# Patient Record
Sex: Female | Born: 1995 | Race: Black or African American | Hispanic: No | Marital: Single | Smoking: Never smoker
Health system: Southern US, Community
[De-identification: ages and names within clinical notes are randomized; demographics above are authoritative.]

## PROBLEM LIST (undated history)

## (undated) DIAGNOSIS — F419 Anxiety disorder, unspecified: Secondary | ICD-10-CM

## (undated) DIAGNOSIS — E049 Nontoxic goiter, unspecified: Secondary | ICD-10-CM

## (undated) HISTORY — PX: WISDOM TOOTH EXTRACTION: SHX21

---
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
## 2016-06-09 IMAGING — US US THYROID BIOPSY
1 series · 13 of 14 positions shown · non-contrast
Comparison: Thyroid ultrasound - 12/07/2014

MEDICATIONS:
None

COMPLICATIONS:
None immediate

INDICATION: Indeterminate thyroid nodule

EXAM:
ULTRASOUND GUIDED FINE NEEDLE ASPIRATION OF INDETERMINATE THYROID
NODULE
TECHNIQUE: Informed written consent was obtained from the patient after a
discussion of the risks, benefits and alternatives to treatment.
Questions regarding the procedure were encouraged and answered. A
timeout was performed prior to the initiation of the procedure.

[Series 1: us thyroid biopsy · 0.07mm/px · 14 acquisitions, 13 frames shown]
[im 1/14]
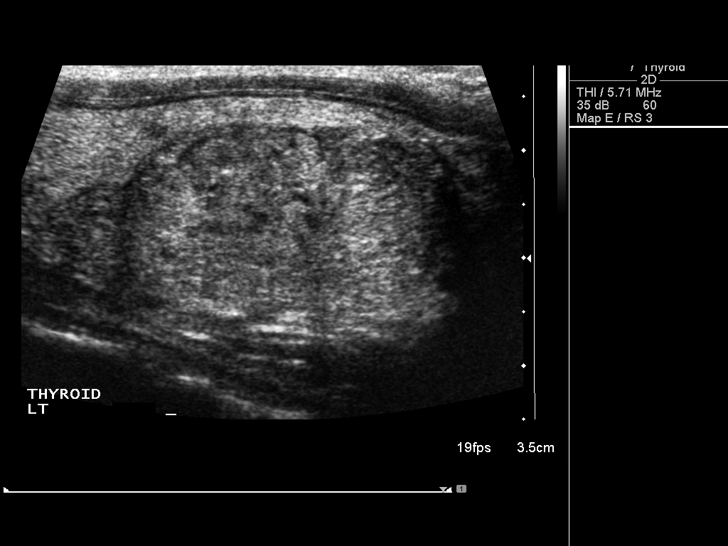
[im 2/14]
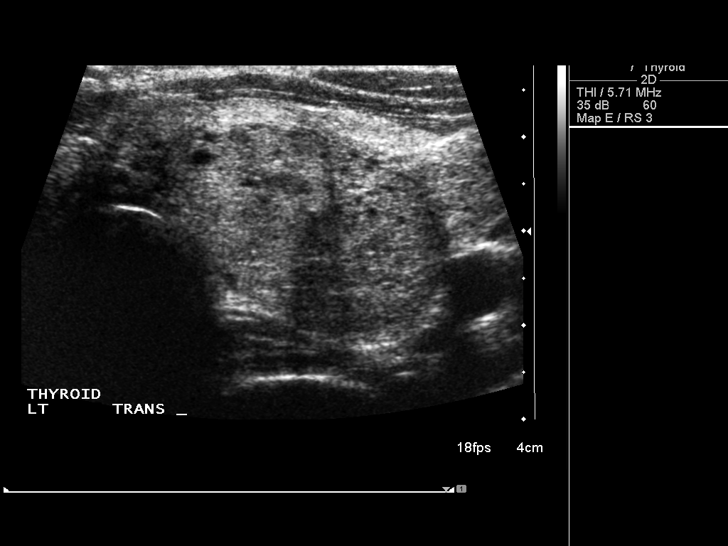
[im 3/14]
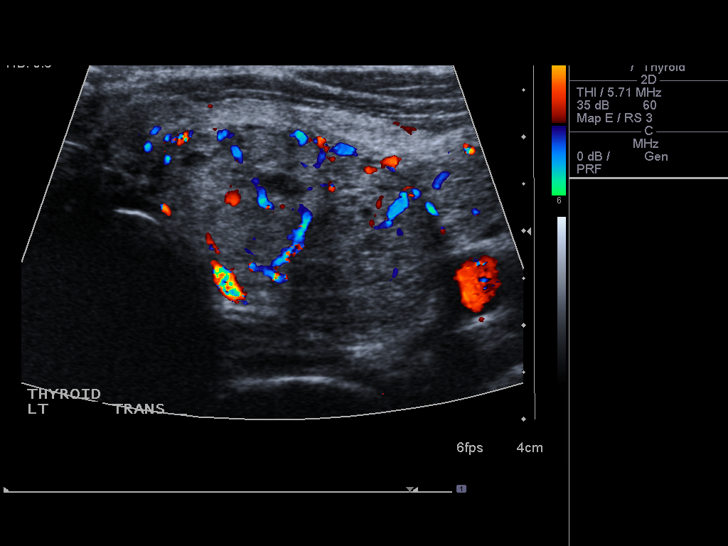
[im 4/14]
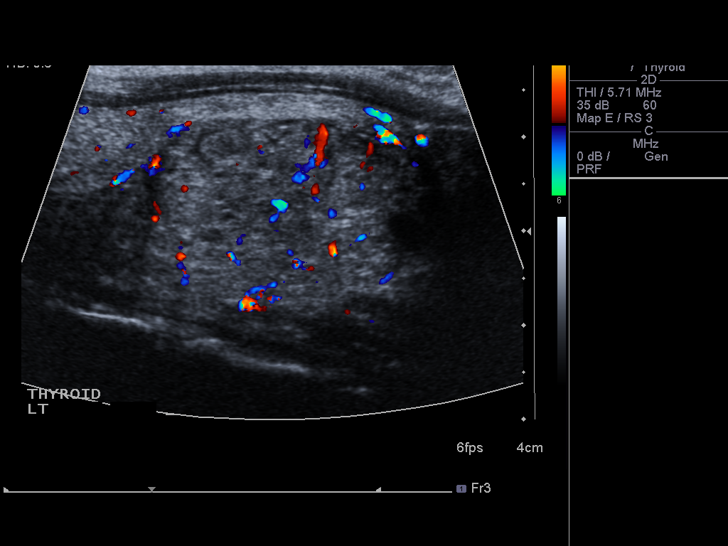
[im 5/14]
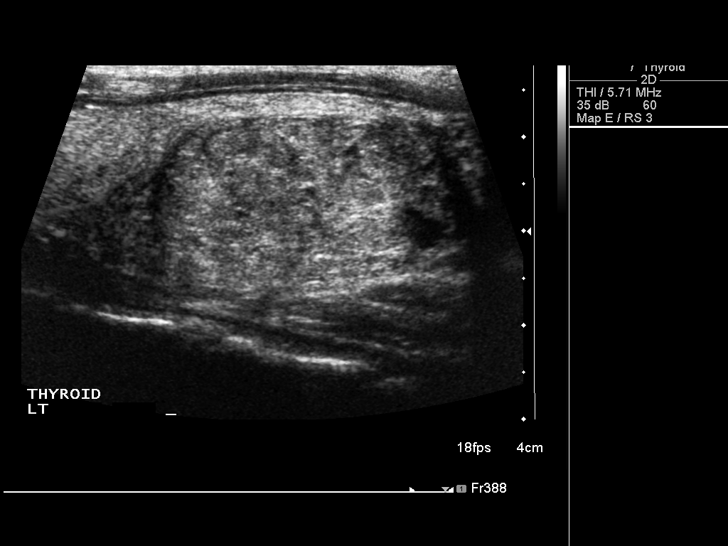
[im 6/14]
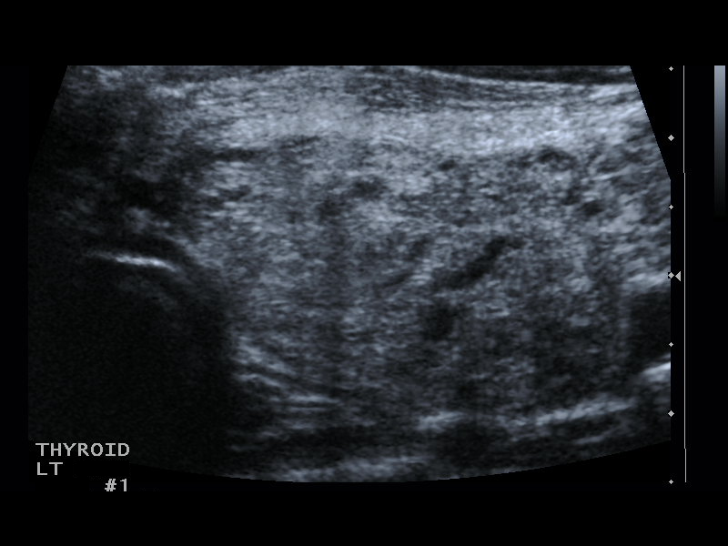
[im 8/14]
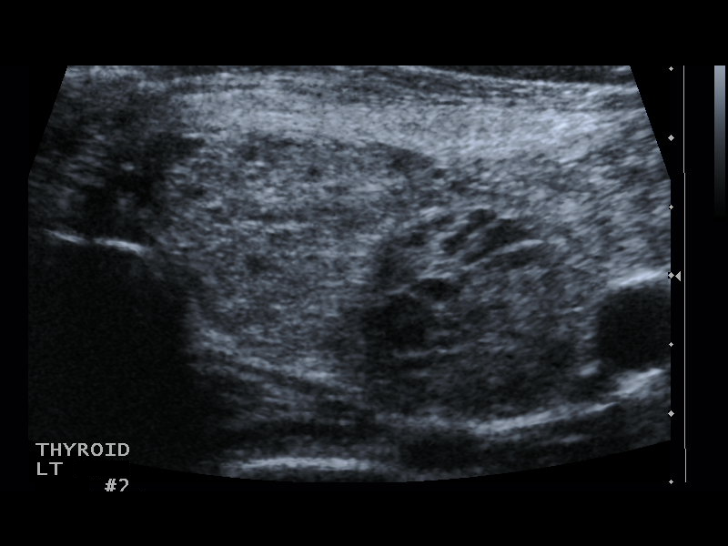
[im 9/14]
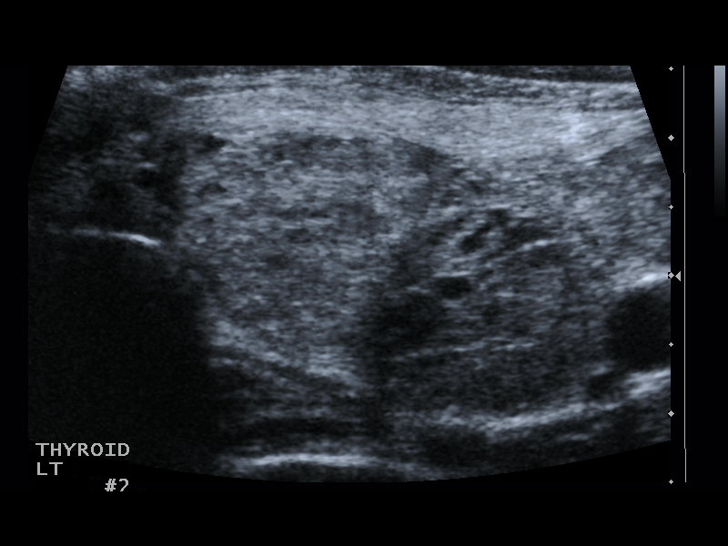
[im 10/14]
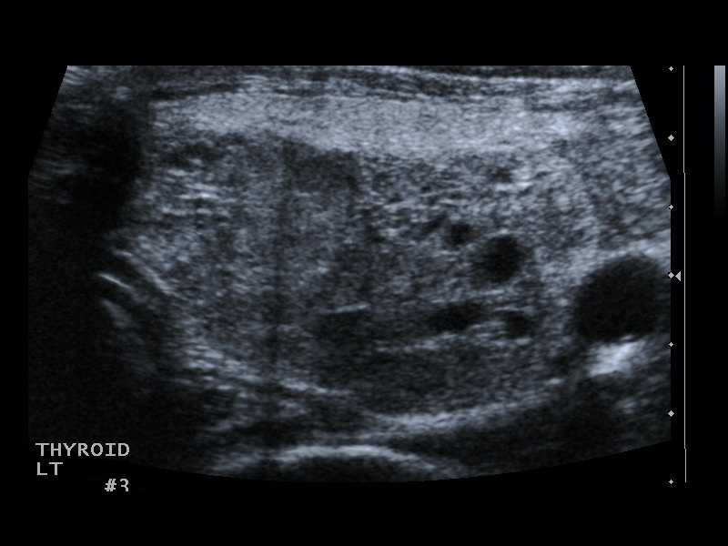
[im 11/14]
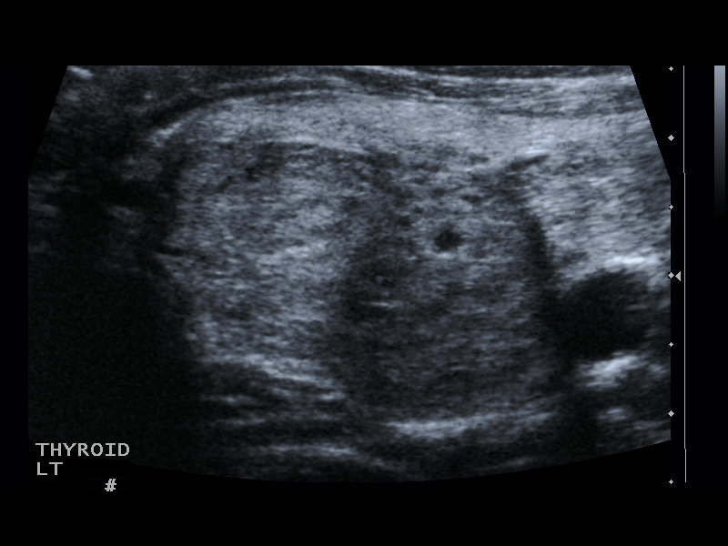
[im 12/14]
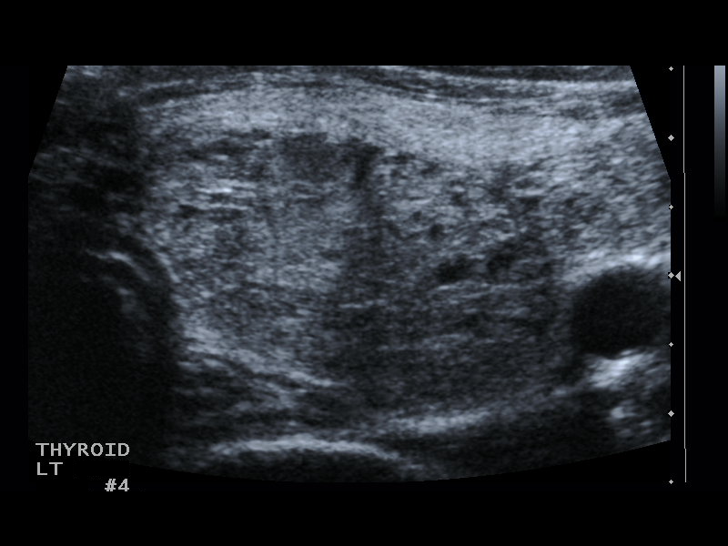
[im 13/14]
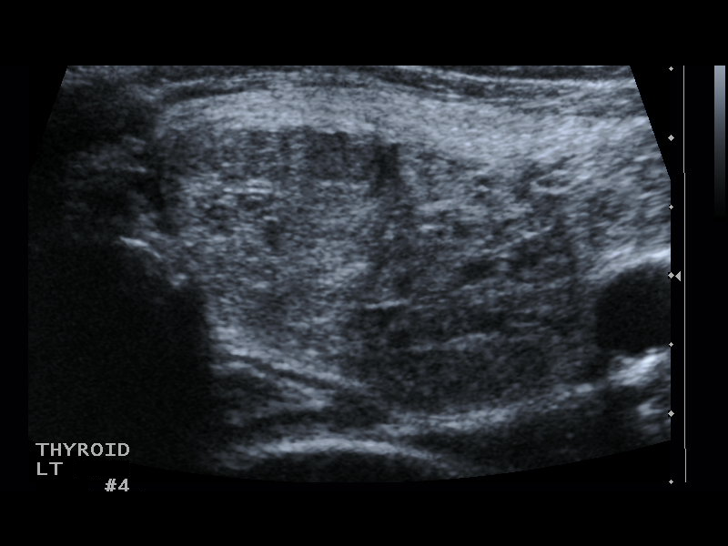
[im 14/14]
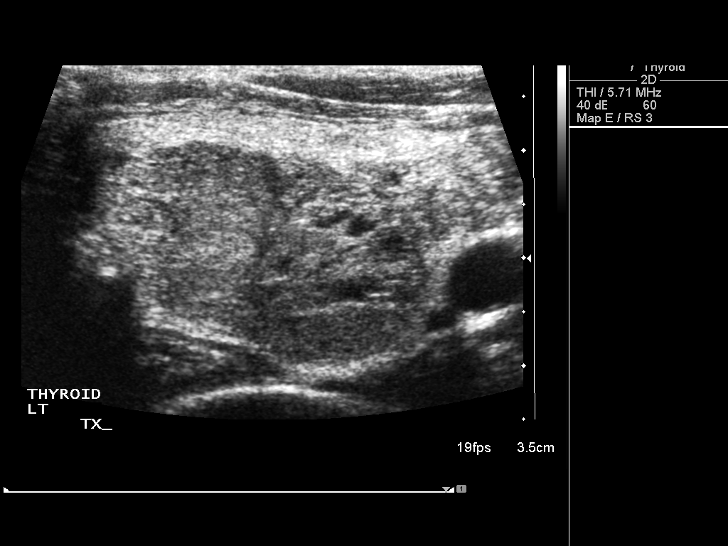

[13 of 14 positions shown; findings below may reference images not displayed]

Pre-procedural ultrasound scanning demonstrated grossly unchanged
appearance of dominant ill-defined at least 4.8 cm mixed echogenic
nodule / mass replacing the caudal aspect the left lobe of the
thyroid.

The procedure was planned. The neck was prepped in the usual sterile
fashion, and a sterile drape was applied covering the operative
field. A timeout was performed prior to the initiation of the
procedure. Local anesthesia was provided with 1% lidocaine.

Under direct ultrasound guidance, 4 FNA biopsies were performed of
the dominant left-sided thyroid nodule/mass with a 27 gauge needle.
The samples were prepared and submitted to pathology.

Limited post procedural scanning was negative for hematoma or
additional complication. Dressings were placed. The patient
tolerated the above procedures procedure well without immediate
postprocedural complication.
IMPRESSION: Technically successful ultrasound guided fine needle aspiration of
dominant left-sided thyroid nodule/mass.

## 2018-05-26 ENCOUNTER — Ambulatory Visit: Payer: Self-pay | Admitting: Surgery

## 2019-02-14 ENCOUNTER — Ambulatory Visit: Payer: Self-pay | Admitting: Surgery

## 2019-03-28 ENCOUNTER — Encounter (HOSPITAL_COMMUNITY): Payer: Self-pay | Admitting: *Deleted

## 2019-03-30 ENCOUNTER — Encounter (HOSPITAL_COMMUNITY): Payer: Self-pay

## 2019-03-30 NOTE — Patient Instructions (Signed)
DUE TO COVID-19 ONLY ONE VISITOR IS ALLOWED IN THE HOSPITAL AT THIS TIME   COVID SWAB TESTING COMPLETED ON: Today, March 31, 2019 at [place], [place] [facility] enter pre surgical testing line (Must self quarantine after testing. Follow instructions on handout.)             Your procedure is scheduled on: Monday, Aug, 3, 2020   Report to [facility] Main  Entrance   Report to Short Stay at 5:30 AM    Report to admitting at AM   Call this number if you have problems the morning of surgery [phone]   Do not eat food or drink liquids :After Midnight.   Brush your teeth the morning of surgery.   Do NOT smoke after Midnight   Take these medicines the morning of surgery with A SIP OF WATER: None                               You may not have any metal on your body including hair pins, jewelry, and body piercings             Do not wear make-up, lotions, powders, perfumes/cologne, or deodorant             Do not wear nail polish.  Do not shave  48 hours prior to surgery.              Do not bring valuables to the hospital. [place].   Contacts, dentures or bridgework may not be worn into surgery.   Bring small overnight bag day of surgery.    Special Instructions: Bring a copy of your healthcare power of attorney and living will documents         the day of surgery if you haven't scanned them in before.              Please read over the following fact sheets you were given:  [facility] - Preparing for Surgery Before surgery, you can play an important role.  Because skin is not sterile, your skin needs to be as free of germs as possible.  You can reduce the number of germs on your skin by washing with CHG (chlorahexidine gluconate) soap before surgery.  CHG is an antiseptic cleaner which kills germs and bonds with the skin to continue killing germs even after washing. Please DO  NOT use if you have an allergy to CHG or antibacterial soaps.  If your skin becomes reddened/irritated stop using the CHG and inform your nurse when you arrive at Short Stay. Do not shave (including legs and underarms) for at least 48 hours prior to the first CHG shower.  You may shave your face/neck.  Please follow these instructions carefully:  1.  Shower with CHG Soap the night before surgery and the  morning of surgery.  2.  If you choose to wash your hair, wash your hair first as usual with your normal  shampoo.  3.  After you shampoo, rinse your hair and body thoroughly to remove the shampoo.                             4.  Use CHG as you would  any other liquid soap.  You can apply chg directly to the skin and wash.  Gently with a scrungie or clean washcloth.  5.  Apply the CHG Soap to your body ONLY FROM THE NECK DOWN.   Do   not use on face/ open                           Wound or open sores. Avoid contact with eyes, ears mouth and   genitals (private parts).                       Wash face,  Genitals (private parts) with your normal soap.             6.  Wash thoroughly, paying special attention to the area where your    surgery  will be performed.  7.  Thoroughly rinse your body with warm water from the neck down.  8.  DO NOT shower/wash with your normal soap after using and rinsing off the CHG Soap.                9.  Pat yourself dry with a clean towel.            10.  Wear clean pajamas.            11.  Place clean sheets on your bed the night of your first shower and do not  sleep with pets. Day of Surgery : Do not apply any lotions/deodorants the morning of surgery.  Please wear clean clothes to the hospital/surgery center.  FAILURE TO FOLLOW THESE INSTRUCTIONS MAY RESULT IN THE CANCELLATION OF YOUR SURGERY  PATIENT SIGNATURE_________________________________  NURSE  SIGNATURE__________________________________  ________________________________________________________________________

## 2019-03-31 ENCOUNTER — Encounter (HOSPITAL_COMMUNITY)
Admission: RE | Admit: 2019-03-31 | Discharge: 2019-03-31 | Disposition: A | Discharge status: Home / Self Care | Payer: 59 | Source: Ambulatory Visit | Attending: Surgery | Admitting: Surgery

## 2019-03-31 ENCOUNTER — Encounter (HOSPITAL_COMMUNITY): Payer: Self-pay

## 2019-03-31 ENCOUNTER — Other Ambulatory Visit: Payer: Self-pay

## 2019-03-31 ENCOUNTER — Ambulatory Visit (HOSPITAL_COMMUNITY)
Admission: RE | Admit: 2019-03-31 | Discharge: 2019-03-31 | Disposition: A | Discharge status: Home / Self Care | Payer: 59 | Source: Ambulatory Visit | Attending: Anesthesiology | Admitting: Anesthesiology

## 2019-03-31 ENCOUNTER — Other Ambulatory Visit (HOSPITAL_COMMUNITY)
Admission: RE | Admit: 2019-03-31 | Discharge: 2019-03-31 | Disposition: A | Discharge status: Home / Self Care | Payer: 59 | Source: Ambulatory Visit | Attending: Surgery | Admitting: Surgery

## 2019-03-31 DIAGNOSIS — F17200 Nicotine dependence, unspecified, uncomplicated: Secondary | ICD-10-CM | POA: Insufficient documentation

## 2019-03-31 DIAGNOSIS — Z01818 Encounter for other preprocedural examination: Secondary | ICD-10-CM

## 2019-03-31 DIAGNOSIS — E042 Nontoxic multinodular goiter: Secondary | ICD-10-CM | POA: Diagnosis not present

## 2019-03-31 DIAGNOSIS — Z20828 Contact with and (suspected) exposure to other viral communicable diseases: Secondary | ICD-10-CM | POA: Insufficient documentation

## 2019-03-31 HISTORY — DX: Anxiety disorder, unspecified: F41.9

## 2019-03-31 HISTORY — DX: Nontoxic goiter, unspecified: E04.9

## 2019-03-31 LAB — CBC
HCT: 42.3 % (ref 36.0–46.0)
Hemoglobin: 12.9 g/dL (ref 12.0–15.0)
MCH: 28.1 pg (ref 26.0–34.0)
MCHC: 30.5 g/dL (ref 30.0–36.0)
MCV: 92.2 fL (ref 80.0–100.0)
Platelets: 283 10*3/uL (ref 150–400)
RBC: 4.59 MIL/uL (ref 3.87–5.11)
RDW: 13.3 % (ref 11.5–15.5)
WBC: 5.4 10*3/uL (ref 4.0–10.5)
nRBC: 0 % (ref 0.0–0.2)

## 2019-03-31 LAB — SARS CORONAVIRUS 2 (TAT 6-24 HRS): SARS Coronavirus 2: NEGATIVE

## 2019-03-31 NOTE — Progress Notes (Signed)
SPOKE W/  Terri Figueroa     SCREENING SYMPTOMS OF COVID 19:   COUGH--NO  RUNNY NOSE--- NO  SORE THROAT---NO  NASAL CONGESTION----NO  SNEEZING----NO  SHORTNESS OF BREATH---NO  DIFFICULTY BREATHING---NO  TEMP >100.0 -----NO  UNEXPLAINED BODY ACHES------NO  CHILLS -------- NO  HEADACHES ---------NO  LOSS OF SMELL/ TASTE --------NO    HAVE YOU OR ANY FAMILY MEMBER TRAVELLED PAST 14 DAYS OUT OF THE   COUNTY---NO STATE----NO COUNTRY----NO  HAVE YOU OR ANY FAMILY MEMBER BEEN EXPOSED TO ANYONE WITH COVID 19? NO

## 2019-04-02 ENCOUNTER — Encounter (HOSPITAL_COMMUNITY): Payer: Self-pay | Admitting: Surgery

## 2019-04-02 NOTE — H&P (Signed)
General Surgery [facility]  Terri Figueroa DOB: 12-11-1995 Single / Language: [place] / Race: Black or African American Female   History of Present Illness   The patient is a 23 year old female who presents with a complaint of Enlarged thyroid.  CHIEF COMPLAINT: multinodular thyroid goiter  Patient returns for follow-up and to discuss proceeding with surgery in the near future. Patient was seen approximately one year ago for multinodular thyroid goiter. This has had a slow and progressive course as it becomes larger. Patient had a recent ultrasound at the office of her endocrinologist which showed interval enlargement, particularly in the left thyroid lobe. Patient did have a TSH level on Jan 18, 2019 which was in the low normal range at 0.43. Patient has developed mild compressive symptoms including globus sensation. She denies any significant dysphagia. She denies dyspnea. She has not had any pain. Patient is anxious to proceed with total thyroidectomy in the near future. Her visit today was conducted with her mother in attendance by a Smart phone.   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 Medications Reconciled  Family History Hypertension  Mother. Thyroid problems  Mother.  Pregnancy / Birth History  Age at menarche  51 years. Contraceptive History  Oral contraceptives. Gravida  0 Para  0 Regular periods   Other Problems  Thyroid Disease   Vitals  Weight: 157 lb Height: 66in Body Surface Area: 1.8 m Body Mass Index: 25.34 kg/m  Temp.: 98.75F  Pulse: 98 (Regular)  BP: 112/76(Sitting, Left Arm,  Standard)  Physical Exam  See vital signs recorded above  GENERAL APPEARANCE Development: normal Nutritional status: normal Gross deformities: none  SKIN Rash, lesions, ulcers: none Induration, erythema: none Nodules: none palpable  EYES Conjunctiva and lids: normal Pupils: equal and reactive Iris: normal bilaterally  EARS, NOSE, MOUTH, THROAT External ears: no lesion or deformity External nose: no lesion or deformity Hearing: grossly normal Lips: no lesion or deformity Dentition: normal for age Oral mucosa: moist  NECK Symmetric: no Trachea: midline Thyroid: Markedly enlarged thyroid gland, left lobe greater than right, soft, with dominant nodules involving the thyroid isthmus. There is some asymmetry to the neck. There is no tenderness. Voice quality is normal. There is no associated lymphadenopathy.  CHEST Respiratory effort: normal Retraction or accessory muscle use: no Breath sounds: normal bilaterally Rales, rhonchi, wheeze: none  CARDIOVASCULAR Auscultation: regular rhythm, normal rate Murmurs: none Pulses: carotid and radial pulse 2+ palpable Lower extremity edema: none Lower extremity varicosities: none  MUSCULOSKELETAL Station and gait: normal Digits and nails: no clubbing or cyanosis Muscle strength: grossly normal all extremities Range of motion: grossly normal all extremities Deformity: none  LYMPHATIC Cervical: none palpable Supraclavicular: none palpable  PSYCHIATRIC Oriented to person, place, and time: yes Mood and affect: normal for situation Judgment and insight: appropriate for situation    Assessment & Plan   GOITER, NODULAR (E04.9)  The patient returns today to discuss thyroidectomy. We had discussed this procedure in detail approximately one year ago with her mother in attendance. I provided them with written literature on thyroid surgery at that time. Her mother participated in today's interview by Smart phone.  Patient  has continued to have enlargement of  the thyroid gland, particularly on the left side. She is beginning to have compressive symptoms including globus sensation. She is anxious to proceed with thyroidectomy.  We discussed the risk and benefits of total thyroidectomy. We discussed the risk of injury to recurrent laryngeal nerves and parathyroid glands. This risk is low, but it is not 0. We discussed the fact that she would require lifelong thyroid hormone replacement which would be prescribed and managed by her endocrinologist, Dr. Ardyth HarpsSteve South. We discussed the hospital stay to be anticipated. We discussed the location and size of her surgical incision. We discussed her postoperative recovery. She understands and wishes to proceed with surgery in the near future.  The risks and benefits of the procedure have been discussed at length with the patient. The patient understands the proposed procedure, potential alternative treatments, and the course of recovery to be expected. All of the patient's questions have been answered at this time. The patient wishes to proceed with surgery.  Darnell Levelodd Garrit Marrow, MD [facility] Office: [phone]

## 2019-04-03 NOTE — Anesthesia Preprocedure Evaluation (Addendum)
Anesthesia Evaluation  Patient identified by MRN, date of birth, ID band Patient awake    Reviewed: Allergy & Precautions, NPO status , Patient's Chart, lab work & pertinent test results  Airway Mallampati: I       Dental no notable dental hx. (+) Teeth Intact   Pulmonary neg pulmonary ROS,    Pulmonary exam normal breath sounds clear to auscultation       Cardiovascular negative cardio ROS Normal cardiovascular exam Rhythm:Regular Rate:Normal     Neuro/Psych Anxiety negative neurological ROS     GI/Hepatic negative GI ROS, Neg liver ROS,   Endo/Other  negative endocrine ROS  Renal/GU negative Renal ROS  negative genitourinary   Musculoskeletal negative musculoskeletal ROS (+)   Abdominal Normal abdominal exam  (+)   Peds  Hematology negative hematology ROS (+)   Anesthesia Other Findings   Reproductive/Obstetrics                            Anesthesia Physical Anesthesia Plan  ASA: II  Anesthesia Plan: General   Post-op Pain Management:    Induction: Intravenous  PONV Risk Score and Plan: 4 or greater and Scopolamine patch - Pre-op, Midazolam, Ondansetron and Dexamethasone  Airway Management Planned: Oral ETT  Additional Equipment: None  Intra-op Plan:   Post-operative Plan: Extubation in OR  Informed Consent: I have reviewed the patients History and Physical, chart, labs and discussed the procedure including the risks, benefits and alternatives for the proposed anesthesia with the patient or authorized representative who has indicated his/her understanding and acceptance.     Dental advisory given  Plan Discussed with: CRNA  Anesthesia Plan Comments:        Anesthesia Quick Evaluation

## 2019-04-04 ENCOUNTER — Encounter (HOSPITAL_COMMUNITY)
Admission: RE | Disposition: A | Discharge status: Home / Self Care | Payer: Self-pay | Source: Home / Self Care | Attending: Surgery

## 2019-04-04 ENCOUNTER — Ambulatory Visit (HOSPITAL_COMMUNITY): Payer: 59 | Admitting: Anesthesiology

## 2019-04-04 ENCOUNTER — Ambulatory Visit (HOSPITAL_COMMUNITY): Payer: 59 | Admitting: Physician Assistant

## 2019-04-04 ENCOUNTER — Other Ambulatory Visit: Payer: Self-pay

## 2019-04-04 ENCOUNTER — Observation Stay (HOSPITAL_COMMUNITY)
Admission: RE | Admit: 2019-04-04 | Discharge: 2019-04-05 | Disposition: A | Discharge status: Home / Self Care | Payer: 59 | Attending: Surgery | Admitting: Surgery

## 2019-04-04 ENCOUNTER — Encounter (HOSPITAL_COMMUNITY): Payer: Self-pay | Admitting: *Deleted

## 2019-04-04 DIAGNOSIS — E042 Nontoxic multinodular goiter: Secondary | ICD-10-CM | POA: Diagnosis not present

## 2019-04-04 DIAGNOSIS — E049 Nontoxic goiter, unspecified: Secondary | ICD-10-CM

## 2019-04-04 HISTORY — PX: THYROIDECTOMY: SHX17

## 2019-04-04 LAB — PREGNANCY, URINE: Preg Test, Ur: NEGATIVE

## 2019-04-04 SURGERY — THYROIDECTOMY
Anesthesia: General

## 2019-04-04 MED ORDER — MIDAZOLAM HCL 5 MG/5ML IJ SOLN
INTRAMUSCULAR | Status: DC | PRN
  Administered 2019-04-04: 2 mg via INTRAVENOUS
  Administered 2019-04-04: 0.5 mg via INTRAVENOUS

## 2019-04-04 MED ORDER — SUCCINYLCHOLINE CHLORIDE 200 MG/10ML IV SOSY
PREFILLED_SYRINGE | INTRAVENOUS | Status: DC | PRN
  Administered 2019-04-04: 120 mg via INTRAVENOUS

## 2019-04-04 MED ORDER — 0.9 % SODIUM CHLORIDE (POUR BTL) OPTIME
TOPICAL | Status: DC | PRN
  Administered 2019-04-04: 1000 mL

## 2019-04-04 MED ORDER — ONDANSETRON HCL 4 MG/2ML IJ SOLN
INTRAMUSCULAR | Status: AC
  Filled 2019-04-04: qty 2

## 2019-04-04 MED ORDER — KETOROLAC TROMETHAMINE 30 MG/ML IJ SOLN
30.0000 mg | Freq: Once | INTRAMUSCULAR | Status: AC | PRN
  Administered 2019-04-04: 30 mg via INTRAVENOUS

## 2019-04-04 MED ORDER — PROPOFOL 10 MG/ML IV BOLUS
INTRAVENOUS | Status: DC | PRN
  Administered 2019-04-04: 200 mg via INTRAVENOUS
  Administered 2019-04-04: 50 mg via INTRAVENOUS

## 2019-04-04 MED ORDER — SCOPOLAMINE 1 MG/3DAYS TD PT72
MEDICATED_PATCH | TRANSDERMAL | Status: AC
  Filled 2019-04-04: qty 1

## 2019-04-04 MED ORDER — PROMETHAZINE HCL 25 MG/ML IJ SOLN: 6.2500 mg | INTRAMUSCULAR | Status: DC | PRN

## 2019-04-04 MED ORDER — SUFENTANIL CITRATE 50 MCG/ML IV SOLN
INTRAVENOUS | Status: DC | PRN
  Administered 2019-04-04: 5 ug via INTRAVENOUS
  Administered 2019-04-04 (×2): 10 ug via INTRAVENOUS
  Administered 2019-04-04: 5 ug via INTRAVENOUS
  Administered 2019-04-04: 10 ug via INTRAVENOUS
  Administered 2019-04-04 (×7): 5 ug via INTRAVENOUS

## 2019-04-04 MED ORDER — LACTATED RINGERS IV SOLN
INTRAVENOUS | Status: DC
  Administered 2019-04-04 (×2): via INTRAVENOUS

## 2019-04-04 MED ORDER — CALCIUM CARBONATE 1250 (500 CA) MG PO TABS
2.0000 | ORAL_TABLET | Freq: Three times a day (TID) | ORAL | Status: DC
  Administered 2019-04-04 – 2019-04-05 (×3): 1000 mg via ORAL
  Filled 2019-04-04 (×3): qty 1

## 2019-04-04 MED ORDER — CHLORHEXIDINE GLUCONATE CLOTH 2 % EX PADS: 6.0000 | MEDICATED_PAD | Freq: Once | CUTANEOUS | Status: DC

## 2019-04-04 MED ORDER — KCL IN DEXTROSE-NACL 20-5-0.45 MEQ/L-%-% IV SOLN
INTRAVENOUS | Status: DC
  Administered 2019-04-04: 15:00:00 via INTRAVENOUS
  Filled 2019-04-04: qty 1000

## 2019-04-04 MED ORDER — ONDANSETRON HCL 4 MG/2ML IJ SOLN
INTRAMUSCULAR | Status: DC | PRN
  Administered 2019-04-04: 4 mg via INTRAVENOUS

## 2019-04-04 MED ORDER — ROCURONIUM BROMIDE 10 MG/ML (PF) SYRINGE
PREFILLED_SYRINGE | INTRAVENOUS | Status: DC | PRN
  Administered 2019-04-04: 5 mg via INTRAVENOUS
  Administered 2019-04-04: 45 mg via INTRAVENOUS

## 2019-04-04 MED ORDER — ONDANSETRON 4 MG PO TBDP
4.0000 mg | ORAL_TABLET | Freq: Four times a day (QID) | ORAL | Status: DC | PRN
  Administered 2019-04-05: 4 mg via ORAL
  Filled 2019-04-04: qty 1

## 2019-04-04 MED ORDER — SUFENTANIL CITRATE 50 MCG/ML IV SOLN
INTRAVENOUS | Status: AC
  Filled 2019-04-04: qty 1

## 2019-04-04 MED ORDER — DEXAMETHASONE SODIUM PHOSPHATE 10 MG/ML IJ SOLN
INTRAMUSCULAR | Status: DC | PRN
  Administered 2019-04-04: 10 mg via INTRAVENOUS

## 2019-04-04 MED ORDER — TRAMADOL HCL 50 MG PO TABS
50.0000 mg | ORAL_TABLET | Freq: Four times a day (QID) | ORAL | Status: DC | PRN
  Administered 2019-04-04 – 2019-04-05 (×2): 50 mg via ORAL
  Filled 2019-04-04 (×2): qty 1

## 2019-04-04 MED ORDER — SCOPOLAMINE 1 MG/3DAYS TD PT72
MEDICATED_PATCH | TRANSDERMAL | Status: DC | PRN
  Administered 2019-04-04: 1 via TRANSDERMAL

## 2019-04-04 MED ORDER — HYDROCODONE-ACETAMINOPHEN 5-325 MG PO TABS
1.0000 | ORAL_TABLET | ORAL | Status: DC | PRN
  Administered 2019-04-04: 1 via ORAL
  Filled 2019-04-04: qty 1

## 2019-04-04 MED ORDER — LABETALOL HCL 5 MG/ML IV SOLN
INTRAVENOUS | Status: AC
  Filled 2019-04-04: qty 4

## 2019-04-04 MED ORDER — HYDROMORPHONE HCL 1 MG/ML IJ SOLN
0.2500 mg | INTRAMUSCULAR | Status: DC | PRN
  Administered 2019-04-04 (×4): 0.5 mg via INTRAVENOUS

## 2019-04-04 MED ORDER — KETOROLAC TROMETHAMINE 30 MG/ML IJ SOLN
INTRAMUSCULAR | Status: AC
  Filled 2019-04-04: qty 1

## 2019-04-04 MED ORDER — MIDAZOLAM HCL 2 MG/2ML IJ SOLN
INTRAMUSCULAR | Status: AC
  Filled 2019-04-04: qty 2

## 2019-04-04 MED ORDER — PROPOFOL 10 MG/ML IV BOLUS
INTRAVENOUS | Status: AC
  Filled 2019-04-04: qty 20

## 2019-04-04 MED ORDER — ACETAMINOPHEN 325 MG PO TABS: 650.0000 mg | ORAL_TABLET | Freq: Four times a day (QID) | ORAL | Status: DC | PRN

## 2019-04-04 MED ORDER — CEFAZOLIN SODIUM-DEXTROSE 2-4 GM/100ML-% IV SOLN
2.0000 g | INTRAVENOUS | Status: AC
  Administered 2019-04-04: 07:00:00 2 g via INTRAVENOUS
  Filled 2019-04-04: qty 100

## 2019-04-04 MED ORDER — HYDROMORPHONE HCL 1 MG/ML IJ SOLN
INTRAMUSCULAR | Status: AC
  Filled 2019-04-04: qty 1

## 2019-04-04 MED ORDER — MEPERIDINE HCL 50 MG/ML IJ SOLN: 6.2500 mg | INTRAMUSCULAR | Status: DC | PRN

## 2019-04-04 MED ORDER — LIDOCAINE 2% (20 MG/ML) 5 ML SYRINGE
INTRAMUSCULAR | Status: DC | PRN
  Administered 2019-04-04: 75 mg via INTRAVENOUS
  Administered 2019-04-04: 25 mg via INTRAVENOUS

## 2019-04-04 MED ORDER — HYDROMORPHONE HCL 1 MG/ML IJ SOLN: 1.0000 mg | INTRAMUSCULAR | Status: DC | PRN

## 2019-04-04 MED ORDER — ROCURONIUM BROMIDE 10 MG/ML (PF) SYRINGE
PREFILLED_SYRINGE | INTRAVENOUS | Status: AC
  Filled 2019-04-04: qty 10

## 2019-04-04 MED ORDER — LIDOCAINE 2% (20 MG/ML) 5 ML SYRINGE
INTRAMUSCULAR | Status: AC
  Filled 2019-04-04: qty 5

## 2019-04-04 MED ORDER — DEXAMETHASONE SODIUM PHOSPHATE 10 MG/ML IJ SOLN
INTRAMUSCULAR | Status: AC
  Filled 2019-04-04: qty 1

## 2019-04-04 MED ORDER — ACETAMINOPHEN 650 MG RE SUPP: 650.0000 mg | Freq: Four times a day (QID) | RECTAL | Status: DC | PRN

## 2019-04-04 MED ORDER — LABETALOL HCL 5 MG/ML IV SOLN
INTRAVENOUS | Status: DC | PRN
  Administered 2019-04-04: 2.5 mg via INTRAVENOUS
  Administered 2019-04-04 (×2): 5 mg via INTRAVENOUS

## 2019-04-04 MED ORDER — ONDANSETRON HCL 4 MG/2ML IJ SOLN: 4.0000 mg | Freq: Four times a day (QID) | INTRAMUSCULAR | Status: DC | PRN

## 2019-04-04 MED ORDER — SUGAMMADEX SODIUM 200 MG/2ML IV SOLN
INTRAVENOUS | Status: DC | PRN
  Administered 2019-04-04: 150 mg via INTRAVENOUS

## 2019-04-04 MED ORDER — SODIUM CHLORIDE (PF) 0.9 % IJ SOLN
INTRAMUSCULAR | Status: AC
  Filled 2019-04-04: qty 20

## 2019-04-04 MED ORDER — SUCCINYLCHOLINE CHLORIDE 200 MG/10ML IV SOSY
PREFILLED_SYRINGE | INTRAVENOUS | Status: AC
  Filled 2019-04-04: qty 10

## 2019-04-04 SURGICAL SUPPLY — 30 items
ADH SKN CLS APL DERMABOND .7 (GAUZE/BANDAGES/DRESSINGS) ×1
APL PRP STRL LF DISP 70% ISPRP (MISCELLANEOUS) ×1
ATTRACTOMAT 16X20 MAGNETIC DRP (DRAPES) ×2 IMPLANT
BLADE SURG 15 STRL LF DISP TIS (BLADE) ×1 IMPLANT
BLADE SURG 15 STRL SS (BLADE) ×2
CHLORAPREP W/TINT 26 (MISCELLANEOUS) ×3 IMPLANT
CLIP VESOCCLUDE MED 6/CT (CLIP) ×5 IMPLANT
CLIP VESOCCLUDE SM WIDE 6/CT (CLIP) ×5 IMPLANT
COVER SURGICAL LIGHT HANDLE (MISCELLANEOUS) ×2 IMPLANT
COVER WAND RF STERILE (DRAPES) ×2 IMPLANT
DERMABOND ADVANCED (GAUZE/BANDAGES/DRESSINGS) ×1
DERMABOND ADVANCED .7 DNX12 (GAUZE/BANDAGES/DRESSINGS) IMPLANT
DRAPE LAPAROTOMY T 98X78 PEDS (DRAPES) ×2 IMPLANT
ELECT PENCIL ROCKER SW 15FT (MISCELLANEOUS) ×2 IMPLANT
ELECT REM PT RETURN 15FT ADLT (MISCELLANEOUS) ×2 IMPLANT
GAUZE 4X4 16PLY RFD (DISPOSABLE) ×3 IMPLANT
GLOVE SURG ORTHO 8.0 STRL STRW (GLOVE) ×2 IMPLANT
GOWN STRL REUS W/TWL XL LVL3 (GOWN DISPOSABLE) ×4 IMPLANT
HEMOSTAT SURGICEL 2X4 FIBR (HEMOSTASIS) ×1 IMPLANT
ILLUMINATOR WAVEGUIDE N/F (MISCELLANEOUS) ×1 IMPLANT
KIT BASIN OR (CUSTOM PROCEDURE TRAY) ×2 IMPLANT
KIT TURNOVER KIT A (KITS) ×1 IMPLANT
PACK BASIC VI WITH GOWN DISP (CUSTOM PROCEDURE TRAY) ×2 IMPLANT
SHEARS HARMONIC 9CM CVD (BLADE) ×2 IMPLANT
SUT MNCRL AB 4-0 PS2 18 (SUTURE) ×2 IMPLANT
SUT VIC AB 3-0 SH 18 (SUTURE) ×4 IMPLANT
SYR BULB IRRIGATION 50ML (SYRINGE) ×2 IMPLANT
TOWEL OR 17X26 10 PK STRL BLUE (TOWEL DISPOSABLE) ×2 IMPLANT
TOWEL OR NON WOVEN STRL DISP B (DISPOSABLE) ×2 IMPLANT
TUBING CONNECTING 10 (TUBING) ×2 IMPLANT

## 2019-04-04 NOTE — Anesthesia Postprocedure Evaluation (Signed)
Anesthesia Post Note  Patient: Coralee J Millis  Procedure(s) Performed: TOTAL THYROIDECTOMY (N/A )     Patient location during evaluation: PACU Anesthesia Type: General Level of consciousness: awake and sedated Pain management: pain level controlled Vital Signs Assessment: post-procedure vital signs reviewed and stable Respiratory status: spontaneous breathing Cardiovascular status: stable Postop Assessment: no apparent nausea or vomiting Anesthetic complications: no    Last Vitals:  Vitals:   04/04/19 1015 04/04/19 1030  BP: (!) 142/99 (!) 132/97  Pulse: 78 76  Resp: 19 19  Temp:  37.2 C  SpO2: 100% 99%    Last Pain:  Vitals:   04/04/19 1030  TempSrc:   PainSc: 2    Pain Goal: Patients Stated Pain Goal: 3 (04/04/19 0609)                 Huston Foley

## 2019-04-04 NOTE — Transfer of Care (Signed)
Immediate Anesthesia Transfer of Care Note  Patient: Terri Figueroa  Procedure(s) Performed: TOTAL THYROIDECTOMY (N/A )  Patient Location: PACU  Anesthesia Type:General  Level of Consciousness: awake, alert , oriented and patient cooperative  Airway & Oxygen Therapy: Patient Spontanous Breathing and Patient connected to face mask oxygen  Post-op Assessment: Report given to RN, Post -op Vital signs reviewed and stable and Patient moving all extremities X 4  Post vital signs: stable  Last Vitals:  Vitals Value Taken Time  BP 130/99 04/04/19 0947  Temp 37 C 04/04/19 0945  Pulse 94 04/04/19 0949  Resp 8 04/04/19 0949  SpO2 100 % 04/04/19 0949  Vitals shown include unvalidated device data.  Last Pain:  Vitals:   04/04/19 0604  TempSrc: Oral      Patients Stated Pain Goal: 3 (01/65/53 7482)  Complications: No apparent anesthesia complications

## 2019-04-04 NOTE — Interval H&P Note (Signed)
History and Physical Interval Note:  04/04/2019 7:09 AM  Terri Figueroa  has presented today for surgery, with the diagnosis of [place].  The various methods of treatment have been discussed with the patient and family. After consideration of risks, benefits and other options for treatment, the patient has consented to    Procedure(s): TOTAL THYROIDECTOMY (N/A) as a surgical intervention.    The patient's history has been reviewed, patient examined, no change in status, stable for surgery.  I have reviewed the patient's chart and labs.  Questions were answered to the patient's satisfaction.    Armandina Gemma, [place] [facility] Office: [place]

## 2019-04-04 NOTE — Anesthesia Procedure Notes (Signed)
Procedure Name: Intubation Date/Time: 04/04/2019 7:31 AM Performed by: Lissa Morales, CRNA Pre-anesthesia Checklist: Patient identified, Emergency Drugs available, Suction available and Patient being monitored Patient Re-evaluated:Patient Re-evaluated prior to induction Oxygen Delivery Method: Circle system utilized Preoxygenation: Pre-oxygenation with 100% oxygen Induction Type: IV induction Ventilation: Mask ventilation without difficulty Laryngoscope Size: Mac and 3 Grade View: Grade I Tube type: Oral Tube size: 7.0 mm Number of attempts: 1 Airway Equipment and Method: Stylet and Oral airway Placement Confirmation: ETT inserted through vocal cords under direct vision,  positive ETCO2 and breath sounds checked- equal and bilateral Secured at: 21 cm Tube secured with: Tape Dental Injury: Teeth and Oropharynx as per pre-operative assessment

## 2019-04-04 NOTE — Op Note (Signed)
Procedure Note  Pre-operative Diagnosis:  Multinodular thyroid goiter  Post-operative Diagnosis:  same  Surgeon:  Darnell Levelodd Momen Ham, MD  Assistant:  Hedda SladePuja Gosai, PA-C   Procedure:  Total thyroidectomy  Anesthesia:  General  Estimated Blood Loss:  50 cc  Drains: none         Specimen: thyroid to pathology  Indications:  Patient returns for follow-up and to discuss proceeding with surgery in the near future. Patient was seen approximately one year ago for multinodular thyroid goiter. This has had a slow and progressive course as it becomes larger. Patient had a recent ultrasound at the office of her endocrinologist which showed interval enlargement, particularly in the left thyroid lobe. Patient did have a TSH level on Jan 18, 2019 which was in the low normal range at 0.43. Patient has developed mild compressive symptoms including globus sensation. She denies any significant dysphagia. She denies dyspnea. She has not had any pain. Patient is anxious to proceed with total thyroidectomy in the near future.  Procedure Details: [facility] The patient was brought to the operating room and placed in a supine position on the operating room table. Following administration of general anesthesia, the patient was positioned and then prepped and draped in the usual aseptic fashion. After ascertaining that an adequate level of anesthesia had been achieved, a small Kocher incision was made with #15 blade. Dissection was carried through subcutaneous tissues and platysma.Hemostasis was achieved with the electrocautery. Skin flaps were elevated cephalad and caudad from the thyroid notch to the sternal notch. A [place] self-retaining retractor was placed for exposure. Strap muscles were incised in the midline and dissection was begun on the left side.  Strap muscles were reflected laterally.  Left thyroid lobe was markedly enlarged and multinodular.  The left lobe  was gently mobilized with blunt dissection. Superior pole vessels were dissected out and divided individually between small and medium ligaclips with the harmonic scalpel. The thyroid lobe was rolled anteriorly. Branches of the inferior thyroid artery were divided between small ligaclips with the harmonic scalpel. Inferior venous tributaries were divided between ligaclips. The superior parathyroid gland was identified and preserved on its vascular pedicle, being moderately enlarged but soft and otherwise normal. The recurrent laryngeal nerve was identified and preserved along its course. The ligament of Allyson SabalBerry was released with the electrocautery and the gland was mobilized onto the anterior trachea. Isthmus was mobilized across the midline. There was no pyramidal lobe present. Dry pack was placed in the left neck.  The right thyroid lobe was gently mobilized with blunt dissection. Right thyroid lobe was moderately enlarged and soft. Superior pole vessels were dissected out and divided between small and medium ligaclips with the Harmonic scalpel. Superior parathyroid was identified and preserved. It was enlarged but soft and otherwise appeared normal.  Inferior venous tributaries were divided between medium ligaclips with the harmonic scalpel. The right thyroid lobe was rolled anteriorly and the branches of the inferior thyroid artery divided between small ligaclips. The right recurrent laryngeal nerve was identified and preserved along its course. The ligament of Allyson SabalBerry was released with the electrocautery. The right thyroid lobe was mobilized onto the anterior trachea and the remainder of the thyroid was dissected off the anterior trachea and the thyroid was completely excised. A suture was used to mark the left lobe. The entire thyroid gland was submitted to pathology for review.  The neck was irrigated with warm saline. Fibrillar was placed throughout  the operative field. Strap muscles were approximated in the  midline with interrupted 3-0 Vicryl sutures. Platysma was closed with interrupted 3-0 Vicryl sutures. Skin was closed with a running 4-0 Monocryl subcuticular suture. Wound was washed and Dermabond was applied. The patient was awakened from anesthesia and brought to the recovery room. The patient tolerated the procedure well.   Armandina Gemma, MD [facility] Office: [phone]

## 2019-04-05 ENCOUNTER — Encounter (HOSPITAL_COMMUNITY): Payer: Self-pay | Admitting: Surgery

## 2019-04-05 DIAGNOSIS — E042 Nontoxic multinodular goiter: Secondary | ICD-10-CM | POA: Diagnosis not present

## 2019-04-05 LAB — BASIC METABOLIC PANEL
Anion gap: 7 (ref 5–15)
BUN: 9 mg/dL (ref 6–20)
CO2: 25 mmol/L (ref 22–32)
Calcium: 8.9 mg/dL (ref 8.9–10.3)
Chloride: 106 mmol/L (ref 98–111)
Creatinine, Ser: 0.59 mg/dL (ref 0.44–1.00)
GFR calc Af Amer: >60 mL/min (ref >60)
GFR calc non Af Amer: >60 mL/min (ref >60)
Glucose, Bld: 103 mg/dL — ABNORMAL HIGH (ref 70–99)
Potassium: 3.7 mmol/L (ref 3.5–5.1)
Sodium: 138 mmol/L (ref 135–145)

## 2019-04-05 MED ORDER — LEVOTHYROXINE SODIUM 88 MCG PO TABS: 88.0000 ug | ORAL_TABLET | Freq: Every day | ORAL | 3 refills | Status: AC

## 2019-04-05 MED ORDER — CALCIUM CARBONATE ANTACID 500 MG PO CHEW: 2.0000 | CHEWABLE_TABLET | Freq: Two times a day (BID) | ORAL | 1 refills | Status: AC

## 2019-04-05 MED ORDER — TRAMADOL HCL 50 MG PO TABS: 50.0000 mg | ORAL_TABLET | Freq: Four times a day (QID) | ORAL | 0 refills | Status: AC | PRN

## 2019-04-05 NOTE — Plan of Care (Signed)
All goals met for d/c

## 2019-04-05 NOTE — Progress Notes (Signed)
Please contact patient and notify of benign pathology results.  Jenel Gierke M. Jacklin Zwick, MD, FACS [facility] Office: [phone]   

## 2019-04-05 NOTE — Progress Notes (Signed)
Pt alert and oriented. Having a little nausea, gave Zofran.  D/C instructions given, all questions answered. Pt d/cd home.

## 2019-04-05 NOTE — Discharge Summary (Signed)
Physician Discharge Summary [facility]  Patient ID: Terri Figueroa MRN: [id number] DOB/AGE: 22/05/1996 22 y.o.  Admit date: 04/04/2019 Discharge date: 04/05/2019  Admission Diagnoses:  Multinodular thyroid goiter  Discharge Diagnoses:  Principal Problem:   Goiter Active Problems:   Multinodular goiter (nontoxic)   Discharged Condition: good  Hospital Course: Patient was admitted for observation following thyroid surgery.  Post op course was uncomplicated.  Pain was well controlled.  Tolerated diet.  Post op calcium level on morning following surgery was 8.9 mg/dl.  Patient was prepared for discharge home on POD#1.  Consults: None  Treatments: surgery: total thyroidectomy  Discharge Exam: Blood pressure 117/85, pulse 91, temperature 98.5 F (36.9 C), temperature source Oral, resp. rate 17, height 5\' 5"  (1.651 m), weight 67.9 kg, last menstrual period 03/30/2019, SpO2 100 %. HEENT - clear Neck - wound dry and intact; voice normal; minimal STS Chest - clear bilaterally Cor - RRR  Disposition: Home  Discharge Instructions    Diet - low sodium heart healthy   Complete by: As directed    Discharge instructions   Complete by: As directed    [facility]  THYROID & PARATHYROID SURGERY:  POST-OP INSTRUCTIONS  Always review your discharge instruction sheet from the facility where your surgery was performed.  A prescription for pain medication may be given to you upon discharge.  Take your pain medication as prescribed.  If narcotic pain medicine is not needed, then you may take acetaminophen (Tylenol) or ibuprofen (Advil) as needed.  Take your usually prescribed medications unless otherwise directed.  If you need a refill on your pain medication, please contact our office during regular business hours.  Prescriptions cannot be processed by our office after 5 pm or on weekends.  Start with a light diet upon arrival home, such as soup and  crackers or toast.  Be sure to drink plenty of fluids daily.  Resume your normal diet the day after surgery.  Most patients will experience some swelling and bruising on the chest and neck area.  Ice packs will help.  Swelling and bruising can take several days to resolve.   It is common to experience some constipation after surgery.  Increasing fluid intake and taking a stool softener (Colace) will usually help or prevent this problem.  A mild laxative (Milk of Magnesia or Miralax) should be taken according to package directions if there has been no bowel movement after 48 hours.  You have steri-strips and a gauze dressing over your incision.  You may remove the gauze bandage on the second day after surgery, and you may shower at that time.  Leave your steri-strips (small skin tapes) in place directly over the incision.  These strips should remain on the skin for 5-7 days and then be removed.  You may get them wet in the shower and pat them dry.  You may resume regular (light) daily activities beginning the next day (such as daily self-care, walking, climbing stairs) gradually increasing activities as tolerated.  You may have sexual intercourse when it is comfortable.  Refrain from any heavy lifting or straining until approved by your doctor.  You may drive when you no longer are taking prescription pain medication, you can comfortably wear a seatbelt, and you can safely maneuver your car and apply brakes.  You should see your doctor in the office for a follow-up appointment approximately three weeks after your surgery.  Make sure that you call for  this appointment within a day or two after you arrive home to insure a convenient appointment time.  WHEN TO CALL YOUR DOCTOR: -- Fever greater than 101.5 -- Inability to urinate -- Nausea and/or vomiting - persistent -- Extreme swelling or bruising -- Continued bleeding from incision -- Increased pain, redness, or drainage from the incision --  Difficulty swallowing or breathing -- Muscle cramping or spasms -- Numbness or tingling in hands or around lips  The clinic staff is available to answer your questions during regular business hours.  Please don't hesitate to call and ask to speak to one of the nurses if you have concerns.  Armandina Gemma, MD [facility] Office: [phone]   Increase activity slowly   Complete by: As directed    No dressing needed   Complete by: As directed      Allergies as of 04/05/2019   No Known Allergies     Medication List    TAKE these medications   calcium carbonate 500 MG chewable tablet Commonly known as: Tums Chew 2 tablets (400 mg of elemental calcium total) by mouth 2 (two) times daily.   levothyroxine 88 MCG tablet Commonly known as: Synthroid Take 1 tablet (88 mcg total) by mouth daily before breakfast.   traMADol 50 MG tablet Commonly known as: ULTRAM Take 1-2 tablets (50-100 mg total) by mouth every 6 (six) hours as needed.        Earnstine Regal, MD, [facility] Office: [phone]   Signed: Armandina Gemma 04/05/2019, 12:30 PM

## 2019-08-04 ENCOUNTER — Other Ambulatory Visit: Payer: Self-pay

## 2019-08-04 DIAGNOSIS — Z20822 Contact with and (suspected) exposure to covid-19: Secondary | ICD-10-CM

## 2019-08-07 LAB — NOVEL CORONAVIRUS, NAA: SARS-CoV-2, NAA: NOT DETECTED

## 2020-09-18 IMAGING — CR CHEST - 2 VIEW
2 series · 2 of 2 positions shown · non-contrast
Comparison: None.

CLINICAL DATA: Pre-op for thyroid surgery. Smoker. No other hx.

EXAM:
CHEST - 2 VIEW

[w chest pa]
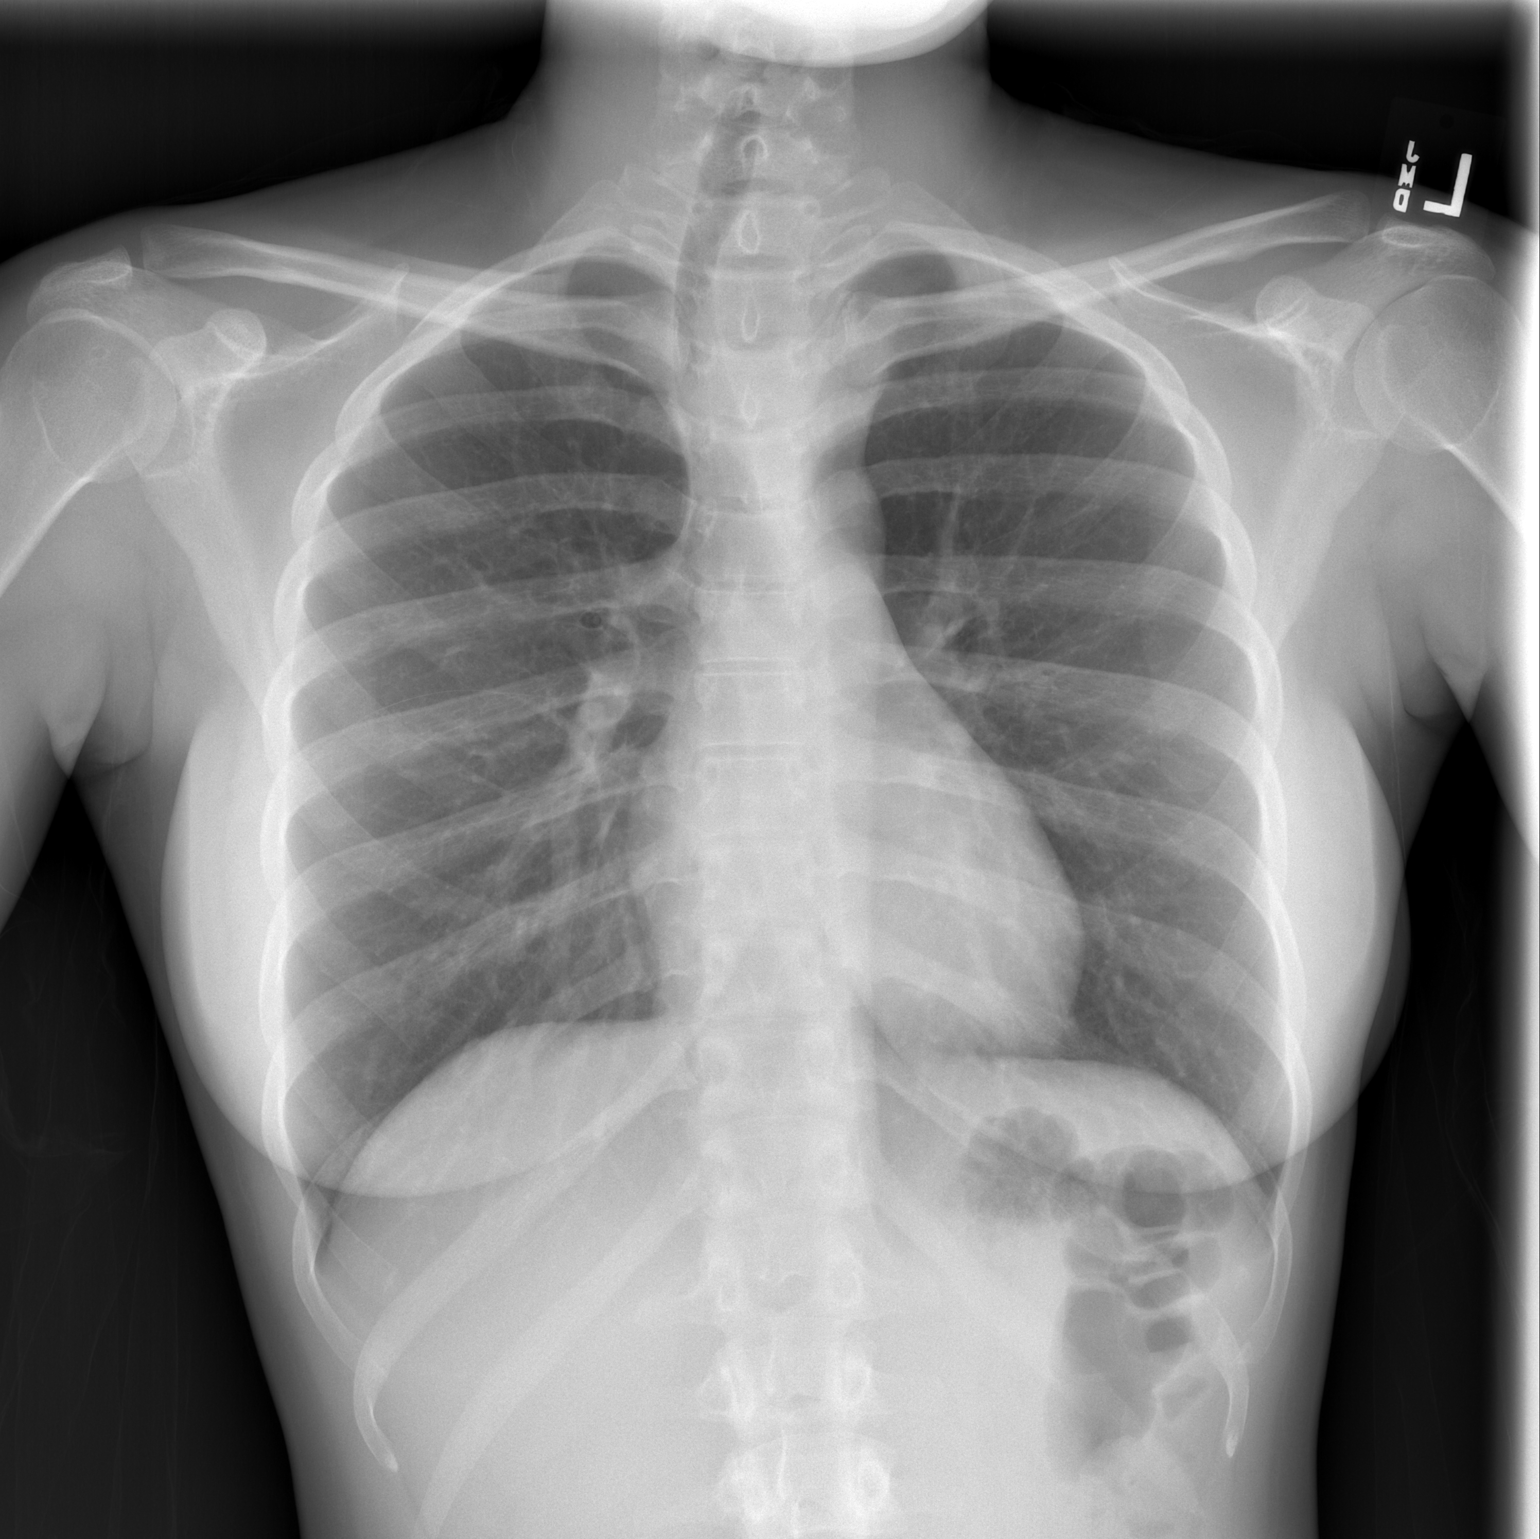

[w chest lat]
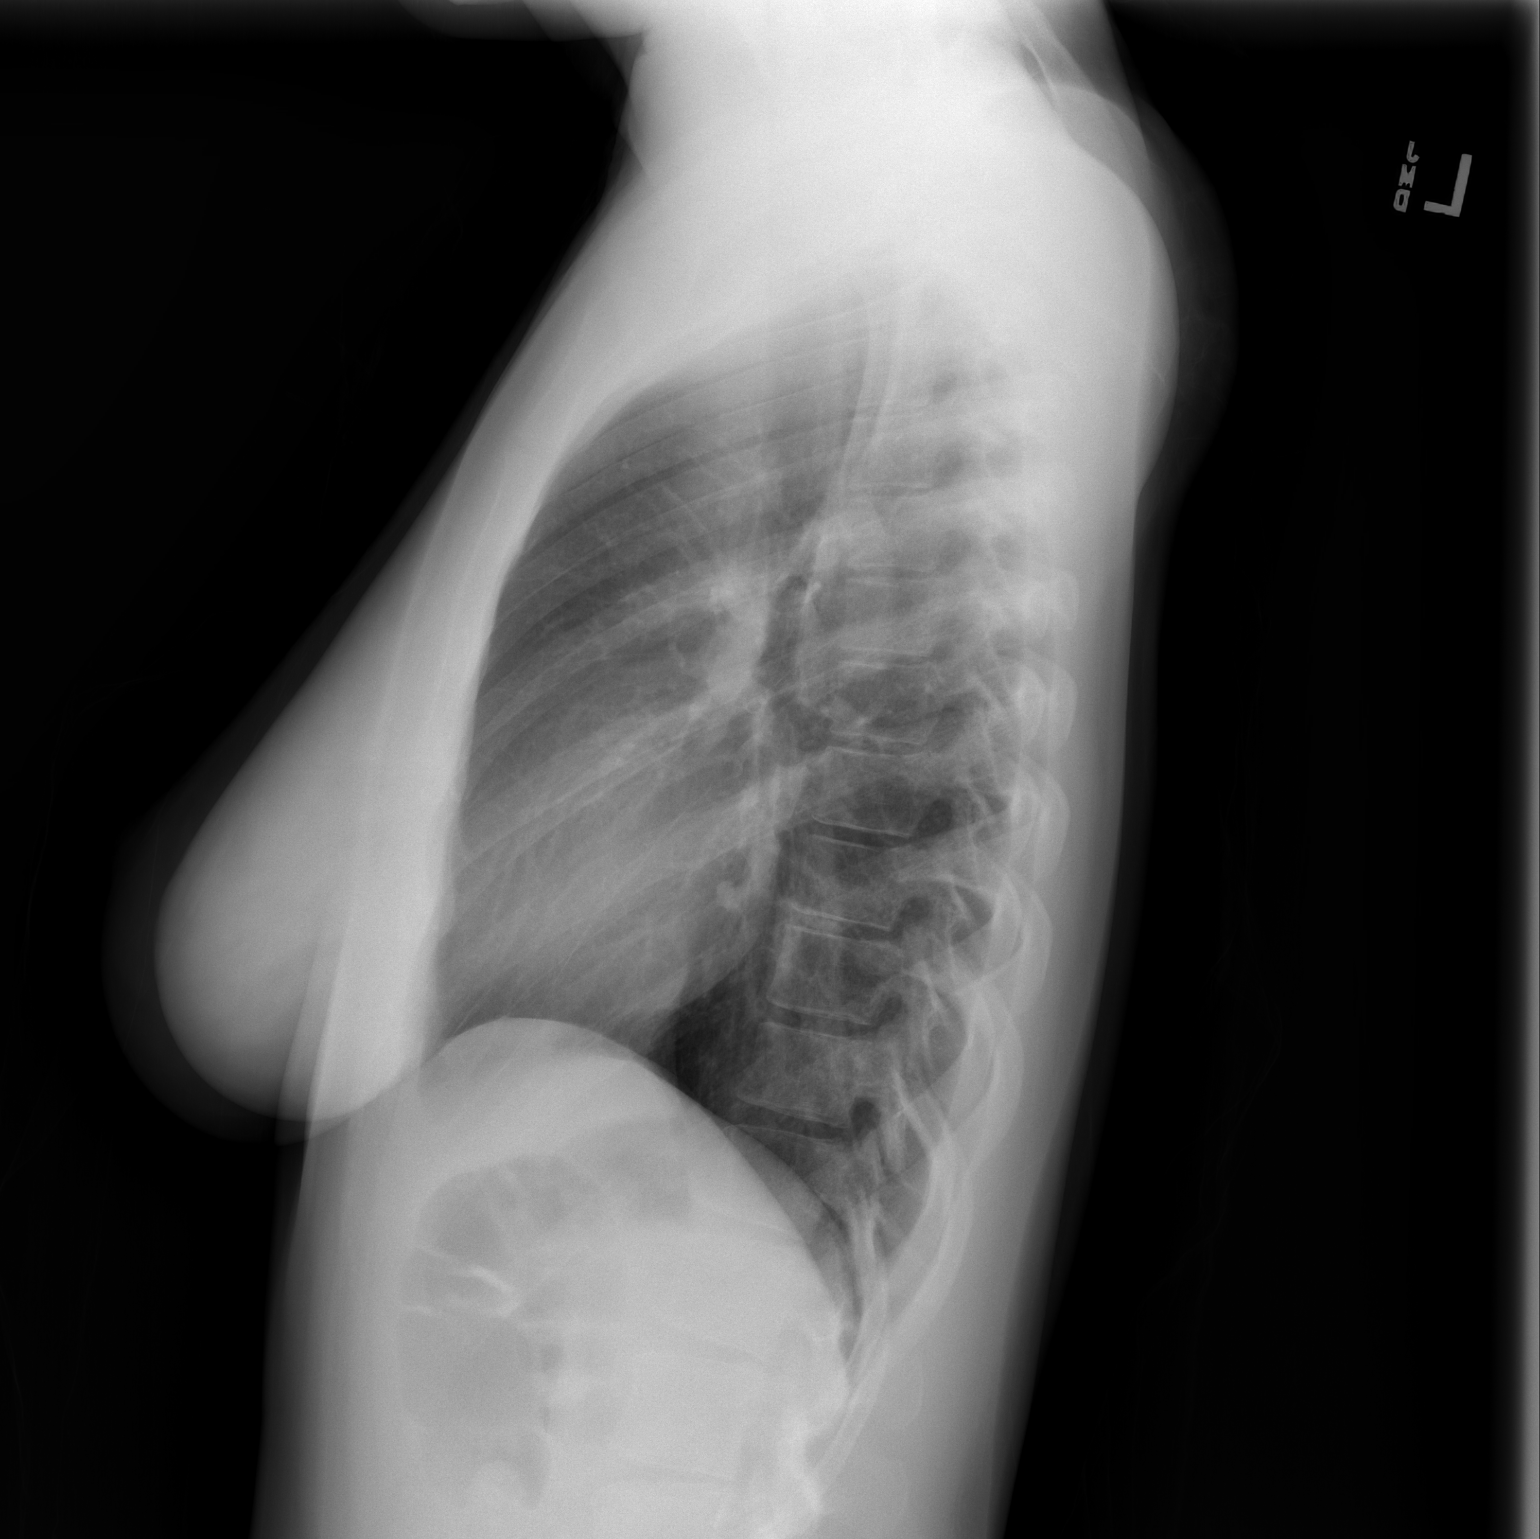

[2 of 2 positions shown; findings below may reference images not displayed]

FINDINGS: There is rightward deviation of the trachea which could be due to
patient's known thyroid goiter. Cardiomediastinal contours within
normal limits. The lungs are clear. No pneumothorax or pleural
effusion. Unremarkable visualized skeleton.
IMPRESSION: 1. No acute cardiopulmonary disease.
2. Rightward deviation of the trachea which could be due to mass
effect from the patient's known thyroid goiter.

## 2023-05-08 DIAGNOSIS — L700 Acne vulgaris: Secondary | ICD-10-CM | POA: Diagnosis not present

## 2023-06-08 DIAGNOSIS — L83 Acanthosis nigricans: Secondary | ICD-10-CM | POA: Diagnosis not present

## 2023-06-08 DIAGNOSIS — E890 Postprocedural hypothyroidism: Secondary | ICD-10-CM | POA: Diagnosis not present

## 2023-08-19 DIAGNOSIS — F909 Attention-deficit hyperactivity disorder, unspecified type: Secondary | ICD-10-CM | POA: Diagnosis not present

## 2023-08-26 DIAGNOSIS — R41840 Attention and concentration deficit: Secondary | ICD-10-CM | POA: Diagnosis not present

## 2023-08-31 DIAGNOSIS — F909 Attention-deficit hyperactivity disorder, unspecified type: Secondary | ICD-10-CM | POA: Diagnosis not present

## 2023-10-05 DIAGNOSIS — F909 Attention-deficit hyperactivity disorder, unspecified type: Secondary | ICD-10-CM | POA: Diagnosis not present

## 2023-11-02 DIAGNOSIS — F909 Attention-deficit hyperactivity disorder, unspecified type: Secondary | ICD-10-CM | POA: Diagnosis not present

## 2023-11-06 DIAGNOSIS — L700 Acne vulgaris: Secondary | ICD-10-CM | POA: Diagnosis not present

## 2023-12-21 DIAGNOSIS — E890 Postprocedural hypothyroidism: Secondary | ICD-10-CM | POA: Diagnosis not present

## 2023-12-22 DIAGNOSIS — Z113 Encounter for screening for infections with a predominantly sexual mode of transmission: Secondary | ICD-10-CM | POA: Diagnosis not present

## 2023-12-22 DIAGNOSIS — Z114 Encounter for screening for human immunodeficiency virus [HIV]: Secondary | ICD-10-CM | POA: Diagnosis not present

## 2023-12-22 DIAGNOSIS — Z01419 Encounter for gynecological examination (general) (routine) without abnormal findings: Secondary | ICD-10-CM | POA: Diagnosis not present

## 2023-12-22 DIAGNOSIS — Z1159 Encounter for screening for other viral diseases: Secondary | ICD-10-CM | POA: Diagnosis not present

## 2024-01-05 DIAGNOSIS — F909 Attention-deficit hyperactivity disorder, unspecified type: Secondary | ICD-10-CM | POA: Diagnosis not present

## 2024-03-10 DIAGNOSIS — F909 Attention-deficit hyperactivity disorder, unspecified type: Secondary | ICD-10-CM | POA: Diagnosis not present

## 2024-03-10 DIAGNOSIS — F411 Generalized anxiety disorder: Secondary | ICD-10-CM | POA: Diagnosis not present

## 2024-06-09 DIAGNOSIS — F909 Attention-deficit hyperactivity disorder, unspecified type: Secondary | ICD-10-CM | POA: Diagnosis not present

## 2024-06-09 DIAGNOSIS — F411 Generalized anxiety disorder: Secondary | ICD-10-CM | POA: Diagnosis not present

## 2024-06-16 DIAGNOSIS — Z23 Encounter for immunization: Secondary | ICD-10-CM | POA: Diagnosis not present

## 2024-06-16 DIAGNOSIS — Z111 Encounter for screening for respiratory tuberculosis: Secondary | ICD-10-CM | POA: Diagnosis not present

## 2024-06-18 DIAGNOSIS — Z111 Encounter for screening for respiratory tuberculosis: Secondary | ICD-10-CM | POA: Diagnosis not present

## 2024-07-11 DIAGNOSIS — Z0000 Encounter for general adult medical examination without abnormal findings: Secondary | ICD-10-CM | POA: Diagnosis not present

## 2024-07-11 DIAGNOSIS — R946 Abnormal results of thyroid function studies: Secondary | ICD-10-CM | POA: Diagnosis not present

## 2024-07-11 DIAGNOSIS — E890 Postprocedural hypothyroidism: Secondary | ICD-10-CM | POA: Diagnosis not present

## 2024-07-26 DIAGNOSIS — L700 Acne vulgaris: Secondary | ICD-10-CM | POA: Diagnosis not present

## 2024-08-18 DIAGNOSIS — F909 Attention-deficit hyperactivity disorder, unspecified type: Secondary | ICD-10-CM | POA: Diagnosis not present

## 2024-08-18 DIAGNOSIS — F411 Generalized anxiety disorder: Secondary | ICD-10-CM | POA: Diagnosis not present
